# Patient Record
Sex: Female | Born: 1960
Health system: Southern US, Community
[De-identification: ages and names within clinical notes are randomized; demographics above are authoritative.]

## PROBLEM LIST (undated history)

## (undated) HISTORY — PX: DILATION AND CURETTAGE OF UTERUS: SHX78

## (undated) HISTORY — PX: SHOULDER SURGERY: SHX246

## (undated) HISTORY — PX: TONSILLECTOMY: SUR1361

---

## 1998-12-24 ENCOUNTER — Other Ambulatory Visit: Admission: RE | Admit: 1998-12-24 | Discharge: 1998-12-24 | Payer: Self-pay | Admitting: Gynecology

## 2000-02-01 ENCOUNTER — Other Ambulatory Visit: Admission: RE | Admit: 2000-02-01 | Discharge: 2000-02-01 | Payer: Self-pay | Admitting: Gynecology

## 2000-03-20 ENCOUNTER — Encounter (INDEPENDENT_AMBULATORY_CARE_PROVIDER_SITE_OTHER): Payer: Self-pay | Admitting: Specialist

## 2000-03-20 ENCOUNTER — Other Ambulatory Visit: Admission: RE | Admit: 2000-03-20 | Discharge: 2000-03-20 | Payer: Self-pay | Admitting: Gynecology

## 2000-06-23 ENCOUNTER — Other Ambulatory Visit: Admission: RE | Admit: 2000-06-23 | Discharge: 2000-06-23 | Payer: Self-pay | Admitting: Gynecology

## 2000-09-21 ENCOUNTER — Other Ambulatory Visit: Admission: RE | Admit: 2000-09-21 | Discharge: 2000-09-21 | Payer: Self-pay | Admitting: Gynecology

## 2001-02-02 ENCOUNTER — Other Ambulatory Visit: Admission: RE | Admit: 2001-02-02 | Discharge: 2001-02-02 | Payer: Self-pay | Admitting: Gynecology

## 2002-02-28 ENCOUNTER — Other Ambulatory Visit: Admission: RE | Admit: 2002-02-28 | Discharge: 2002-02-28 | Payer: Self-pay | Admitting: Gynecology

## 2002-12-19 ENCOUNTER — Other Ambulatory Visit: Admission: RE | Admit: 2002-12-19 | Discharge: 2002-12-19 | Payer: Self-pay | Admitting: Gynecology

## 2003-07-01 ENCOUNTER — Other Ambulatory Visit: Admission: RE | Admit: 2003-07-01 | Discharge: 2003-07-01 | Payer: Self-pay | Admitting: Gynecology

## 2004-08-05 ENCOUNTER — Other Ambulatory Visit: Admission: RE | Admit: 2004-08-05 | Discharge: 2004-08-05 | Payer: Self-pay | Admitting: Gynecology

## 2004-10-29 ENCOUNTER — Ambulatory Visit (HOSPITAL_COMMUNITY): Admission: RE | Admit: 2004-10-29 | Discharge: 2004-10-29 | Payer: Self-pay | Admitting: Gynecology

## 2004-10-29 ENCOUNTER — Ambulatory Visit (HOSPITAL_BASED_OUTPATIENT_CLINIC_OR_DEPARTMENT_OTHER): Admission: RE | Admit: 2004-10-29 | Discharge: 2004-10-29 | Payer: Self-pay | Admitting: Gynecology

## 2005-07-05 ENCOUNTER — Other Ambulatory Visit: Admission: RE | Admit: 2005-07-05 | Discharge: 2005-07-05 | Payer: Self-pay | Admitting: Gynecology

## 2005-11-24 ENCOUNTER — Other Ambulatory Visit: Admission: RE | Admit: 2005-11-24 | Discharge: 2005-11-24 | Payer: Self-pay | Admitting: Obstetrics and Gynecology

## 2006-02-22 ENCOUNTER — Other Ambulatory Visit: Admission: RE | Admit: 2006-02-22 | Discharge: 2006-02-22 | Payer: Self-pay | Admitting: Gynecology

## 2007-02-26 ENCOUNTER — Other Ambulatory Visit: Admission: RE | Admit: 2007-02-26 | Discharge: 2007-02-26 | Payer: Self-pay | Admitting: Gynecology

## 2008-03-12 ENCOUNTER — Ambulatory Visit: Payer: Self-pay | Admitting: Women's Health

## 2008-03-12 ENCOUNTER — Encounter: Payer: Self-pay | Admitting: Women's Health

## 2008-03-12 ENCOUNTER — Other Ambulatory Visit: Admission: RE | Admit: 2008-03-12 | Discharge: 2008-03-12 | Payer: Self-pay | Admitting: Obstetrics and Gynecology

## 2008-04-14 ENCOUNTER — Ambulatory Visit: Payer: Self-pay | Admitting: Gynecology

## 2010-10-29 NOTE — Op Note (Signed)
NAMEJUSTICE, AGUIRRE             ACCOUNT NO.:  [id number]   MEDICAL RECORD NO.:  [id number]          PATIENT TYPE:  AMB   LOCATION:  [facility]                         FACILITY:  [facility]   PHYSICIAN:  Ivor Costa. Farrel Gobble, M.D. DATE OF BIRTH:  1961-03-17   DATE OF PROCEDURE:  10/29/2004  DATE OF DISCHARGE:                                 OPERATIVE REPORT   PREOPERATIVE DIAGNOSIS:  Cervical intraepithelial neoplasia II.   POSTOPERATIVE DIAGNOSIS:  Cervical intraepithelial neoplasia II.   PROCEDURE:  Laser vaporization of the cervix with CO2 laser.   SURGEON:  Dr. Farrel Gobble.   ANESTHESIA:  MAC with paracervical block, equal portions, 2% lidocaine with  0.5% Marcaine.   ESTIMATED BLOOD LOSS:  Minimal.   FINDINGS:  There was a prominent ectropion with scarring from a previous  cryo with grossly obvious definite aceto-white epithelium.  No abnormal  vessels.  The remainder of the cervix was unremarkable.   COMPLICATIONS:  None.   PATHOLOGY:  None.   PROCEDURE:  The patient was taken to the operating room, placed in the  supine position.  IV sedation was induced.  Then placed in dorsal lithotomy  position.  She was prepped and draped in the usual fashion.  Moist lap  sponges were placed circumferentially around the operative site as well as a  moist sponge in the rectum.  A brushed bivalve speculum was placed in the  vagina.  The cervix was visualized.  Paracervical block for a total of 20 cc  was placed circumferentially around the cervix with equal portions of 2%  lidocaine and 0.5% Marcaine.  Acetic acid was then placed on the cervix, and  after adequate bathing, the cervix was inspected in its entirety, using  normal as well as green filter.  The area of concern, as previously noted on  the prior colposcopy, was seen and visualized.  The CO2 laser was set at 10  watts.  The area was vaporized.  It included most of the scar of the  previous cryo.  The patency of the canal was confirmed.   The laser was then  deflected for purposes of cautery.  The cervix was then injected with  Pitressin solution.  Monsel's was then followed.  It was noted to be  hemostatic afterwards.  The patient tolerated the procedure well.  Lap,  sponge, and needle counts were correct.  She was transferred to the PACU in  stable condition.      THL/MEDQ  D:  10/29/2004  T:  10/29/2004  Job:  [id number]

## 2014-09-16 ENCOUNTER — Other Ambulatory Visit: Payer: Self-pay | Admitting: Orthopedic Surgery

## 2014-09-16 DIAGNOSIS — M25512 Pain in left shoulder: Secondary | ICD-10-CM

## 2014-09-18 ENCOUNTER — Ambulatory Visit
Admission: RE | Admit: 2014-09-18 | Discharge: 2014-09-18 | Disposition: A | Discharge status: Home / Self Care | Payer: 59 | Source: Ambulatory Visit | Attending: Orthopedic Surgery | Admitting: Orthopedic Surgery

## 2014-09-18 DIAGNOSIS — M25512 Pain in left shoulder: Secondary | ICD-10-CM

## 2016-06-04 ENCOUNTER — Inpatient Hospital Stay (HOSPITAL_COMMUNITY): Payer: 59

## 2016-06-04 ENCOUNTER — Encounter (HOSPITAL_COMMUNITY): Payer: Self-pay

## 2016-06-04 ENCOUNTER — Inpatient Hospital Stay (HOSPITAL_COMMUNITY)
Admission: AD | Admit: 2016-06-04 | Discharge: 2016-06-04 | Disposition: A | Discharge status: Home / Self Care | Payer: 59 | Source: Ambulatory Visit | Attending: Obstetrics and Gynecology | Admitting: Obstetrics and Gynecology

## 2016-06-04 DIAGNOSIS — Z7983 Long term (current) use of bisphosphonates: Secondary | ICD-10-CM | POA: Insufficient documentation

## 2016-06-04 DIAGNOSIS — M1990 Unspecified osteoarthritis, unspecified site: Secondary | ICD-10-CM | POA: Diagnosis not present

## 2016-06-04 DIAGNOSIS — R109 Unspecified abdominal pain: Secondary | ICD-10-CM

## 2016-06-04 DIAGNOSIS — Z8041 Family history of malignant neoplasm of ovary: Secondary | ICD-10-CM | POA: Insufficient documentation

## 2016-06-04 DIAGNOSIS — G8929 Other chronic pain: Secondary | ICD-10-CM | POA: Insufficient documentation

## 2016-06-04 DIAGNOSIS — Z882 Allergy status to sulfonamides status: Secondary | ICD-10-CM | POA: Insufficient documentation

## 2016-06-04 DIAGNOSIS — R1031 Right lower quadrant pain: Secondary | ICD-10-CM | POA: Diagnosis not present

## 2016-06-04 DIAGNOSIS — K5909 Other constipation: Secondary | ICD-10-CM | POA: Diagnosis not present

## 2016-06-04 DIAGNOSIS — M810 Age-related osteoporosis without current pathological fracture: Secondary | ICD-10-CM | POA: Diagnosis not present

## 2016-06-04 LAB — URINALYSIS, ROUTINE W REFLEX MICROSCOPIC
Bilirubin Urine: NEGATIVE
Glucose, UA: NEGATIVE mg/dL
Ketones, ur: NEGATIVE mg/dL
LEUKOCYTES UA: NEGATIVE
NITRITE: NEGATIVE
PROTEIN: NEGATIVE mg/dL
Specific Gravity, Urine: <1.005 — ABNORMAL LOW (ref 1.005–1.030)
pH: 6 (ref 5.0–8.0)

## 2016-06-04 LAB — CBC
HEMATOCRIT: 39.6 % (ref 36.0–46.0)
Hemoglobin: 13.6 g/dL (ref 12.0–15.0)
MCH: 31.3 pg (ref 26.0–34.0)
MCHC: 34.3 g/dL (ref 30.0–36.0)
MCV: 91 fL (ref 78.0–100.0)
Platelets: 242 10*3/uL (ref 150–400)
RBC: 4.35 MIL/uL (ref 3.87–5.11)
RDW: 13.4 % (ref 11.5–15.5)
WBC: 5.6 10*3/uL (ref 4.0–10.5)

## 2016-06-04 LAB — URINALYSIS, MICROSCOPIC (REFLEX): BACTERIA UA: NONE SEEN

## 2016-06-04 MED ORDER — CYCLOBENZAPRINE HCL 10 MG PO TABS: 10.0000 mg | ORAL_TABLET | Freq: Two times a day (BID) | ORAL | 0 refills | Status: AC | PRN

## 2016-06-04 NOTE — Discharge Instructions (Signed)
Abdominal Pain, Adult Abdominal pain can be caused by many things. Often, abdominal pain is not serious and it gets better with no treatment or by being treated at home. However, sometimes abdominal pain is serious. Your health care provider will do a medical history and a physical exam to try to determine the cause of your abdominal pain. Follow these instructions at home:  Take over-the-counter and prescription medicines only as told by your health care provider. Do not take a laxative unless told by your health care provider.  Drink enough fluid to keep your urine clear or pale yellow.  Watch your condition for any changes.  Keep all follow-up visits as told by your health care provider. This is important. Contact a health care provider if:  Your abdominal pain changes or gets worse.  You are not hungry or you lose weight without trying.  You are constipated or have diarrhea for more than 2-3 days.  You have pain when you urinate or have a bowel movement.  Your abdominal pain wakes you up at night.  Your pain gets worse with meals, after eating, or with certain foods.  You are throwing up and cannot keep anything down.  You have a fever. Get help right away if:  Your pain does not go away as soon as your health care provider told you to expect.  You cannot stop throwing up.  Your pain is only in areas of the abdomen, such as the right side or the left lower portion of the abdomen.  You have bloody or black stools, or stools that look like tar.  You have severe pain, cramping, or bloating in your abdomen.  You have signs of dehydration, such as:  Dark urine, very little urine, or no urine.  Cracked lips.  Dry mouth.  Sunken eyes.  Sleepiness.  Weakness. This information is not intended to replace advice given to you by your health care provider. Make sure you discuss any questions you have with your health care provider. Document Released: 03/09/2005 Document  Revised: 12/18/2015 Document Reviewed: 11/11/2015 Elsevier Interactive Patient Education  2017 [employer]  Constipation, Adult Constipation is when a person:  Poops (has a bowel movement) fewer times in a week than normal.  Has a hard time pooping.  Has poop that is dry, hard, or bigger than normal. Follow these instructions at home: Eating and drinking  Eat foods that have a lot of fiber, such as:  Fresh fruits and vegetables.  Whole grains.  Beans.  Eat less of foods that are high in fat, low in fiber, or overly processed, such as:  [place] fries.  Hamburgers.  Cookies.  Candy.  Soda.  Drink enough fluid to keep your pee (urine) clear or pale yellow. General instructions  Exercise regularly or as told by your doctor.  Go to the restroom when you feel like you need to poop. Do not hold it in.  Take over-the-counter and prescription medicines only as told by your doctor. These include any fiber supplements.  Do pelvic floor retraining exercises, such as:  Doing deep breathing while relaxing your lower belly (abdomen).  Relaxing your pelvic floor while pooping.  Watch your condition for any changes.  Keep all follow-up visits as told by your doctor. This is important. Contact a doctor if:  You have pain that gets worse.  You have a fever.  You have not pooped for 4 days.  You throw up (vomit).  You are not hungry.  You lose  weight.  You are bleeding from the anus.  You have thin, pencil-like poop (stool). Get help right away if:  You have a fever, and your symptoms suddenly get worse.  You leak poop or have blood in your poop.  Your belly feels hard or bigger than normal (is bloated).  You have very bad belly pain.  You feel dizzy or you faint. This information is not intended to replace advice given to you by your health care provider. Make sure you discuss any questions you have with your health care provider. Document Released:  11/16/2007 Document Revised: 12/18/2015 Document Reviewed: 11/18/2015 Elsevier Interactive Patient Education  2017 [employer].

## 2016-06-04 NOTE — MAU Note (Addendum)
Patient presents with right side and lower back for 2 months, thought is was a pulled muscle, had colonoscopy yesterday and everything was clear, took fiber and drank water, having to take something to sleep, she has had a 15 pound weight loss which has been unintentional.

## 2016-06-04 NOTE — MAU Provider Note (Signed)
History     CSN: [id number]  Arrival date and time: 06/04/16 [id number]   First Provider Initiated Contact with Patient 06/04/16 1009      Chief Complaint  Patient presents with  . Abdominal Pain  . Constipation   HPI   [name] is a 55 y.o. female [phone] with a history of osteoporosis and arthritis,  here in MAU with right sided abdominal pain. The pain has been present X 2 months. She was seen by her PCP for this pain and was told it was a pulled muscle. The pain did not improve despite their recommendation so she was sent to GI.  She had a colonoscopy and endoscopy yesterday and she was told that every thing was normal. Last night she was only able to sleep on her left side and this morning her BM seemed very "narrow". She is concerned that something may be pressing on the outside of her intestine causing the misshaped stool. She has a sensation that something is bleeding on the inside.  Her mother's aunt had ovarian cancer and passed away and she is concerned about cancer.   Her last pap was in September 2017 and it was normal   OB History    Gravida Para Term Preterm AB Living   3 2 2   1 2    SAB TAB Ectopic Multiple Live Births           2      No past medical history on file.  Past Surgical History:  Procedure Laterality Date  . DILATION AND CURETTAGE OF UTERUS    . SHOULDER SURGERY    . TONSILLECTOMY      No family history on file.  Social History  Substance Use Topics  . Smoking status: Never Smoker  . Smokeless tobacco: Never Used  . Alcohol use Yes    Allergies:  Allergies  Allergen Reactions  . Sulfamethoxazole Hives    Prescriptions Prior to Admission  Medication Sig Dispense Refill Last Dose  . Calcium Carbonate-Vit D-Min (CALCIUM 1200 PO) Take 1 tablet by mouth daily.    06/03/2016  . cholecalciferol (VITAMIN D) 1000 units tablet Take 1,000 Units by mouth daily.   06/03/2016  . Cyanocobalamin (VITAMIN B 12 PO) Take 1 tablet by mouth  daily.    06/03/2016  . ibandronate (BONIVA) 150 MG tablet Take 150 mg by mouth every 30 (thirty) days.    Past Month at Unknown time  . Omega-3 Fatty Acids (FISH OIL) 1000 MG CAPS Take 1,000 mg by mouth daily.    06/03/2016  . vitamin E 100 UNIT capsule Take 100 Units by mouth daily.    06/03/2016   Results for orders placed or performed during the hospital encounter of 06/04/16 (from the past 48 hour(s))  Urinalysis, Routine w reflex microscopic     Status: Abnormal   Collection Time: 06/04/16  9:45 AM  Result Value Ref Range   Color, Urine YELLOW YELLOW   APPearance CLEAR CLEAR   Specific Gravity, Urine <1.005 (L) 1.005 - 1.030   pH 6.0 5.0 - 8.0   Glucose, UA NEGATIVE NEGATIVE mg/dL   Hgb urine dipstick MODERATE (A) NEGATIVE   Bilirubin Urine NEGATIVE NEGATIVE   Ketones, ur NEGATIVE NEGATIVE mg/dL   Protein, ur NEGATIVE NEGATIVE mg/dL   Nitrite NEGATIVE NEGATIVE   Leukocytes, UA NEGATIVE NEGATIVE  Urinalysis, Microscopic (reflex)     Status: Abnormal   Collection Time: 06/04/16  9:45 AM  Result Value Ref Range  RBC / HPF 0-5 0 - 5 RBC/hpf   WBC, UA 0-5 0 - 5 WBC/hpf   Bacteria, UA NONE SEEN NONE SEEN   Squamous Epithelial / LPF 0-5 (A) NONE SEEN  CBC     Status: None   Collection Time: 06/04/16 10:23 AM  Result Value Ref Range   WBC 5.6 4.0 - 10.5 K/uL   RBC 4.35 3.87 - 5.11 MIL/uL   Hemoglobin 13.6 12.0 - 15.0 g/dL   HCT 39.6 36.0 - 46.0 %   MCV 91.0 78.0 - 100.0 fL   MCH 31.3 26.0 - 34.0 pg   MCHC 34.3 30.0 - 36.0 g/dL   RDW 13.4 11.5 - 15.5 %   Platelets 242 150 - 400 K/uL   US Transvaginal Non-ob  Result Date: 06/04/2016 CLINICAL DATA:  Right-sided low back pain for 2 months. Post colonoscopy on 06/03/2016. 15 pounds weight loss. EXAM: TRANSABDOMINAL AND TRANSVAGINAL ULTRASOUND OF PELVIS TECHNIQUE: Both transabdominal and transvaginal ultrasound examinations of the pelvis were performed. Transabdominal technique was performed for global imaging of the pelvis  including uterus, ovaries, adnexal regions, and pelvic cul-de-sac. It was necessary to proceed with endovaginal exam following the transabdominal exam to visualize the bilateral adnexa. COMPARISON:  None FINDINGS: Uterus Measurements: 5.8 x 2.7 x 4.9 cm. No fibroids or other mass visualized. Endometrium Thickness: 5.2 mm.  No focal abnormality visualized. Right ovary Not visualized due to overlying bowel gas. Left ovary Not visualized due to overlying bowel gas. Other findings No abnormal free fluid. IMPRESSION: Normal appearance of the uterus. Nonvisualization of bilateral ovaries due to overlying bowel gas. Electronically Signed   By: Fidela Salisbury M.D.   On: 06/04/2016 12:15   US Pelvis Complete  Result Date: 06/04/2016 CLINICAL DATA:  Right-sided low back pain for 2 months. Post colonoscopy on 06/03/2016. 15 pounds weight loss. EXAM: TRANSABDOMINAL AND TRANSVAGINAL ULTRASOUND OF PELVIS TECHNIQUE: Both transabdominal and transvaginal ultrasound examinations of the pelvis were performed. Transabdominal technique was performed for global imaging of the pelvis including uterus, ovaries, adnexal regions, and pelvic cul-de-sac. It was necessary to proceed with endovaginal exam following the transabdominal exam to visualize the bilateral adnexa. COMPARISON:  None FINDINGS: Uterus Measurements: 5.8 x 2.7 x 4.9 cm. No fibroids or other mass visualized. Endometrium Thickness: 5.2 mm.  No focal abnormality visualized. Right ovary Not visualized due to overlying bowel gas. Left ovary Not visualized due to overlying bowel gas. Other findings No abnormal free fluid. IMPRESSION: Normal appearance of the uterus. Nonvisualization of bilateral ovaries due to overlying bowel gas. Electronically Signed   By: Fidela Salisbury M.D.   On: 06/04/2016 12:15    Review of Systems  Gastrointestinal: Positive for abdominal pain and constipation. Negative for nausea and vomiting.  Genitourinary: Negative for dysuria,  frequency and urgency.   Physical Exam   Blood pressure 137/96, pulse 87, temperature 98.7 F (37.1 C), temperature source Oral, resp. rate 18, height 5\' 9"  (1.753 m), weight 144 lb (65.3 kg).  Physical Exam  Constitutional: She is oriented to person, place, and time. She appears well-developed and well-nourished. No distress.  HENT:  Head: Normocephalic.  Respiratory: Effort normal.  GI: Soft. Normal appearance and bowel sounds are normal. She exhibits no distension and no fluid wave. There is no rigidity, no rebound, no guarding and no CVA tenderness. No hernia. Hernia confirmed negative in the right inguinal area.    Tenderness with deep palpation on in the right lower quadrant favoring the right hip. No rigidity, guarding or  rebound.   Musculoskeletal: Normal range of motion.  Neurological: She is alert and oriented to person, place, and time.  Skin: She is not diaphoretic.  Psychiatric: She has a normal mood and affect. Her behavior is normal.    MAU Course  Procedures  None  MDM  CBC UA Discussed patient with Dr. Gaetano Net. Will proceed with Pelvic US  [place] normal other than bowel gas likely secondary to recent endoscopy and colonoscopy.   Assessment and Plan   A:  1. Abdominal pain, chronic, right lower quadrant   2. Abdominal pain   3. Constipation, chronic     P:  Discharge home in stable condition Unlikely a GYN problem at this time.  Highly encouraged patient to call her PCP next week to schedule an office visit. It may be appropriate at this time to have a CT scan done.  Rx: Flexeril  Ok to continue ibuprofen as directed on the bottle.  Encourage over the counter Gas Hildred Priest, NP 06/04/2016 2:58 PM

## 2016-07-18 ENCOUNTER — Other Ambulatory Visit: Payer: Self-pay | Admitting: Orthopedic Surgery

## 2016-07-18 DIAGNOSIS — M545 Low back pain: Secondary | ICD-10-CM

## 2016-07-18 DIAGNOSIS — R52 Pain, unspecified: Secondary | ICD-10-CM

## 2016-07-25 ENCOUNTER — Ambulatory Visit
Admission: RE | Admit: 2016-07-25 | Discharge: 2016-07-25 | Disposition: A | Discharge status: Home / Self Care | Payer: 59 | Source: Ambulatory Visit | Attending: Orthopedic Surgery | Admitting: Orthopedic Surgery

## 2016-07-25 DIAGNOSIS — R52 Pain, unspecified: Secondary | ICD-10-CM

## 2016-07-25 DIAGNOSIS — M545 Low back pain: Secondary | ICD-10-CM

## 2016-11-16 ENCOUNTER — Other Ambulatory Visit: Payer: Self-pay | Admitting: Urology

## 2016-11-16 DIAGNOSIS — D49512 Neoplasm of unspecified behavior of left kidney: Secondary | ICD-10-CM

## 2016-11-16 DIAGNOSIS — D49511 Neoplasm of unspecified behavior of right kidney: Secondary | ICD-10-CM

## 2016-11-25 ENCOUNTER — Ambulatory Visit (HOSPITAL_COMMUNITY)
Admission: RE | Admit: 2016-11-25 | Discharge: 2016-11-25 | Disposition: A | Discharge status: Home / Self Care | Payer: 59 | Source: Ambulatory Visit | Attending: Urology | Admitting: Urology

## 2016-11-25 DIAGNOSIS — N281 Cyst of kidney, acquired: Secondary | ICD-10-CM | POA: Diagnosis not present

## 2016-11-25 DIAGNOSIS — D4101 Neoplasm of uncertain behavior of right kidney: Secondary | ICD-10-CM | POA: Diagnosis not present

## 2016-11-25 DIAGNOSIS — D49511 Neoplasm of unspecified behavior of right kidney: Secondary | ICD-10-CM

## 2016-11-25 DIAGNOSIS — D49512 Neoplasm of unspecified behavior of left kidney: Secondary | ICD-10-CM

## 2016-11-25 DIAGNOSIS — D4102 Neoplasm of uncertain behavior of left kidney: Secondary | ICD-10-CM | POA: Diagnosis present

## 2016-11-25 MED ORDER — GADOBENATE DIMEGLUMINE 529 MG/ML IV SOLN
15.0000 mL | Freq: Once | INTRAVENOUS | Status: AC | PRN
  Administered 2016-11-25: 13 mL via INTRAVENOUS

## 2017-12-21 IMAGING — MR MR ABDOMEN WO/W CM
9 of 17 series · 22 of 48 positions shown · IV contrast (multihance)
Comparison: Outside hospital CT abdomen/ pelvis dated 06/09/2016

CLINICAL DATA: Bilateral renal lesions

EXAM:
MRI ABDOMEN WITHOUT AND WITH CONTRAST
TECHNIQUE: Multiplanar multisequence MR imaging of the abdomen was performed
both before and after the administration of intravenous contrast.
CONTRAST:  13mL MULTIHANCE GADOBENATE DIMEGLUMINE 529 MG/ML IV SOLN

[Series 3: DWI b500 · axial · 6.0mm · 1.48mm/px · z∈[-99,+189]mm · 4 of 76 slices shown]
[im 1/76]
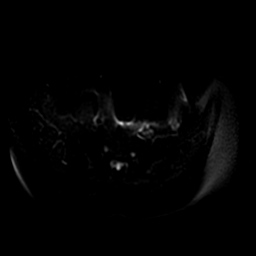
[im 26/76]
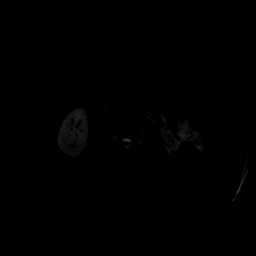
[im 51/76]
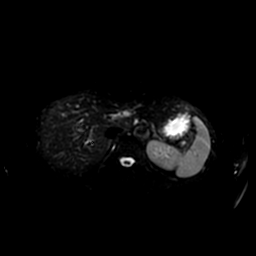
[im 76/76]
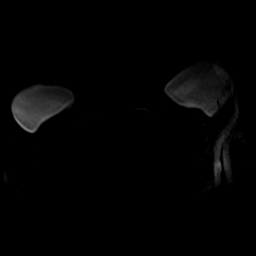

[Series 4: T2 fat-sat · axial · 5.0mm · 0.78mm/px · z∈[-87,+178]mm · 3 of 54 slices shown]
[im 1/54]
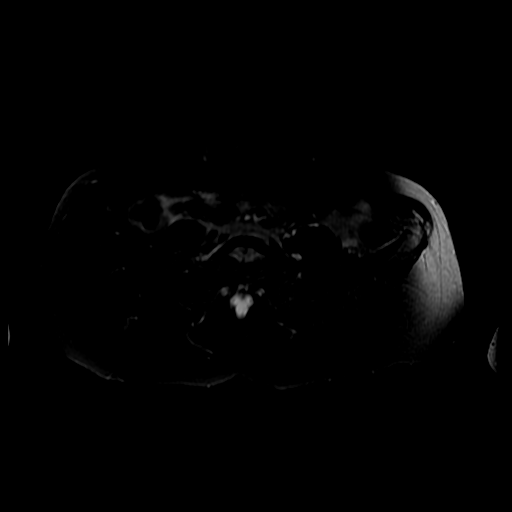
[im 27/54]
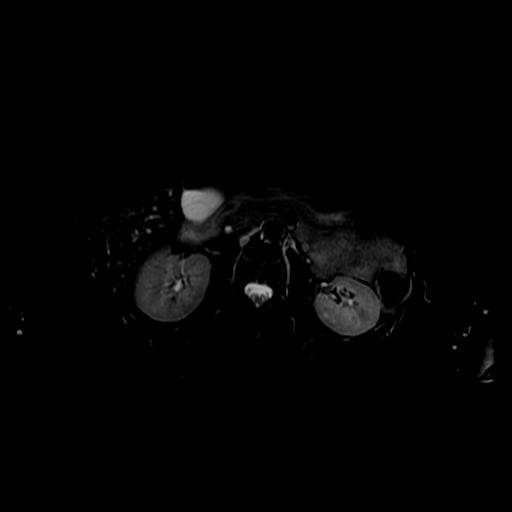
[im 54/54]
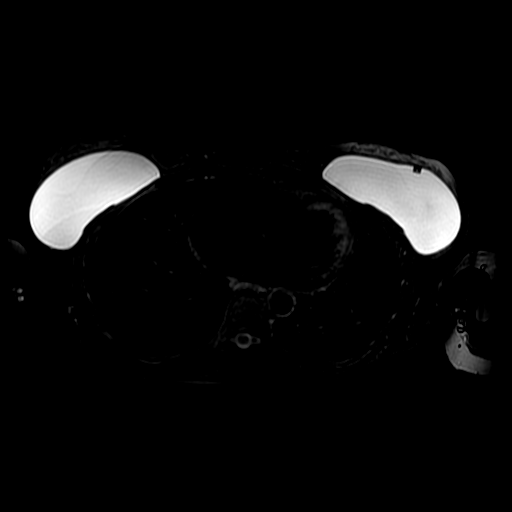

[Series 5: ax dualecho · axial · 5.0mm · 0.78mm/px · z∈[-87,+178]mm · 5 of 108 slices shown]
[im 1/108]
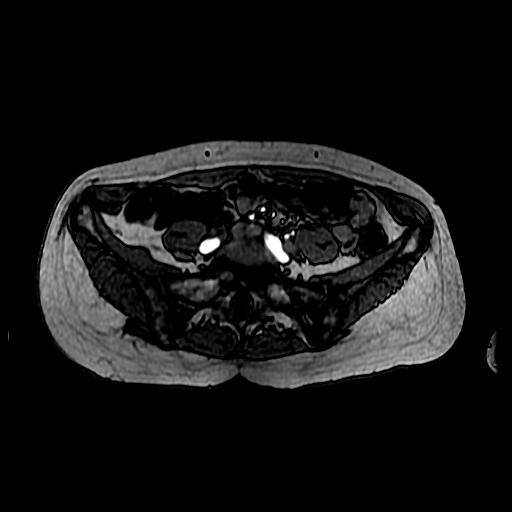
[im 27/108]
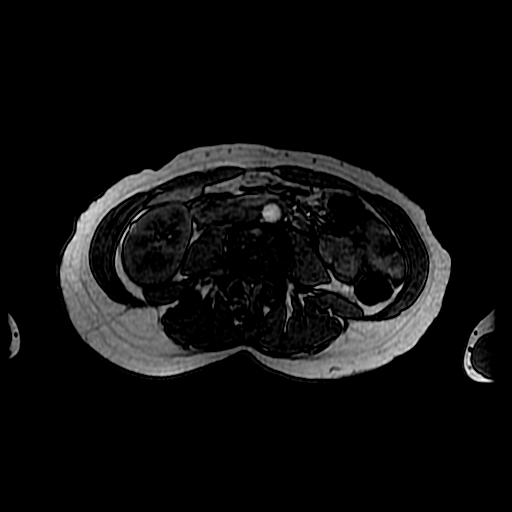
[im 54/108]
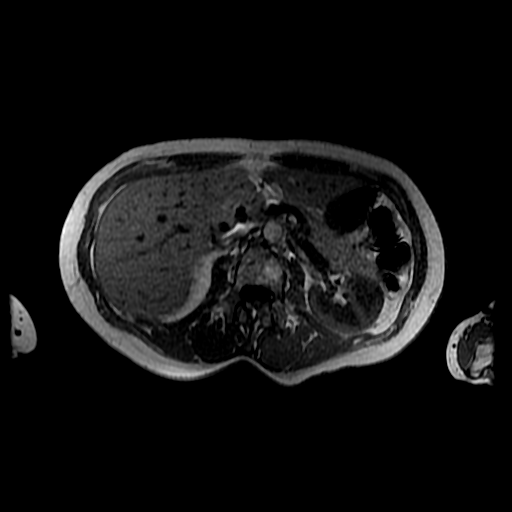
[im 81/108]
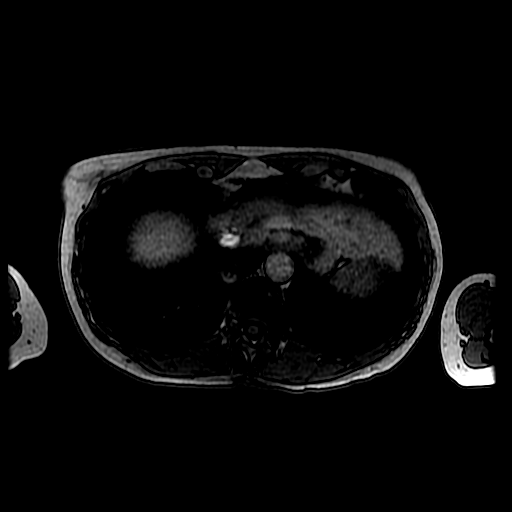
[im 108/108]
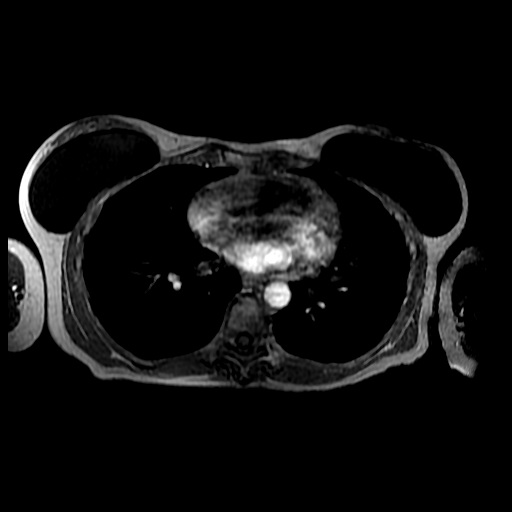

[Series 6: T2 · axial · 5.0mm · 0.78mm/px · z∈[-97,+178]mm · 2 of 56 slices shown (1 of 2)]
[im 1/56]
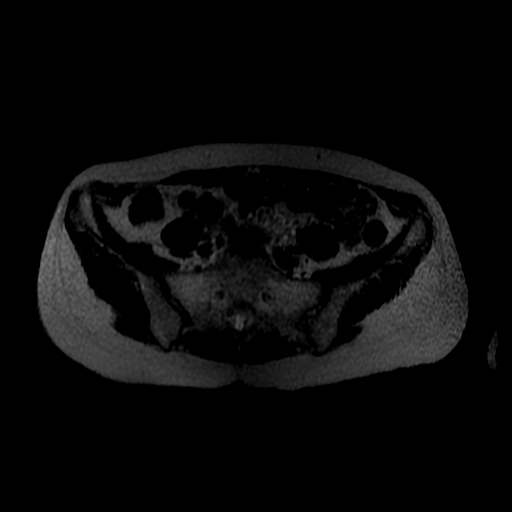
[im 56/56]
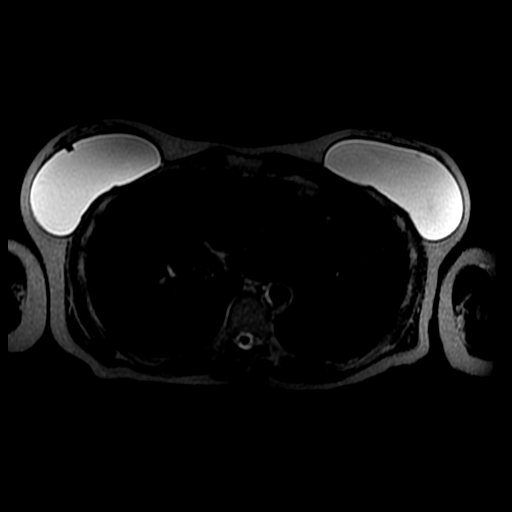

[Series 7: T2 · coronal · 5.0mm · 0.86mm/px · 1 of 32 slices shown (2 of 2)]
[im 1/32]
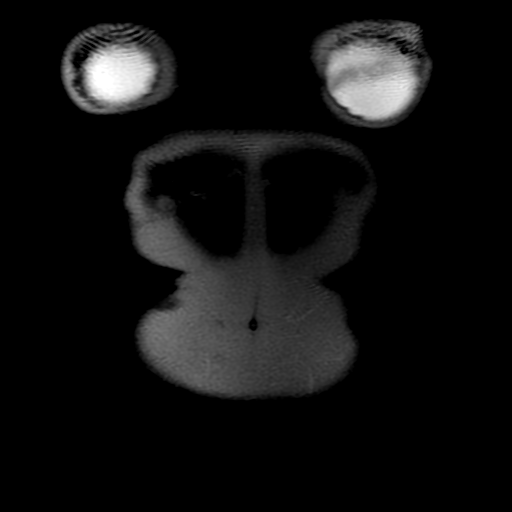

[Series 8: bSSFP · axial · 5.0mm · 0.78mm/px · z∈[-87,+178]mm · 2 of 54 slices shown]
[im 1/54]
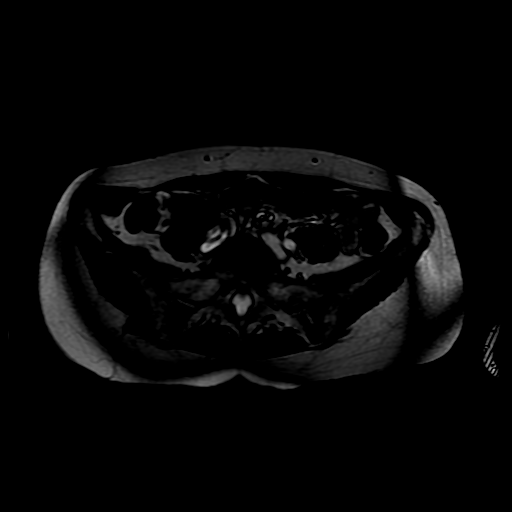
[im 54/54]
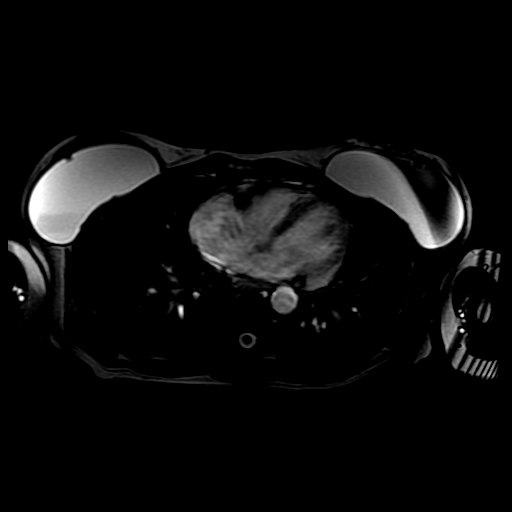

[Series 300: DWI · axial · 6.0mm · 1.48mm/px · 1 of 38 slices shown]
[im 1/38]
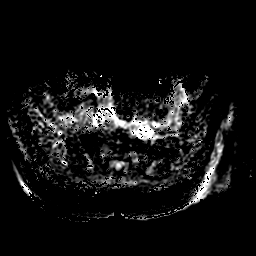

[Series 900: T1 dynamic · axial · 5.8mm · 0.78mm/px · z∈[-95,+157]mm · 3 of 88 slices shown (1 of 2)]
[im 1/88]
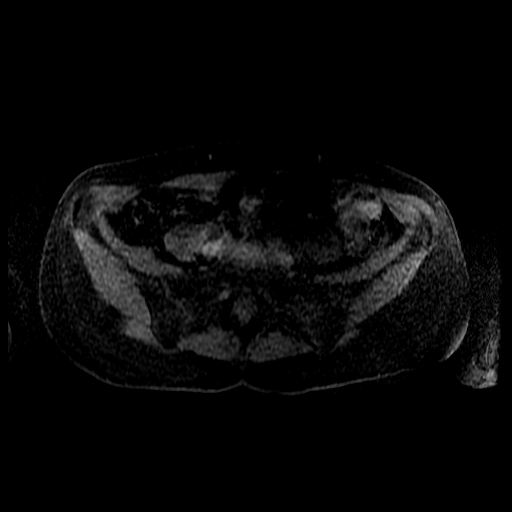
[im 44/88]
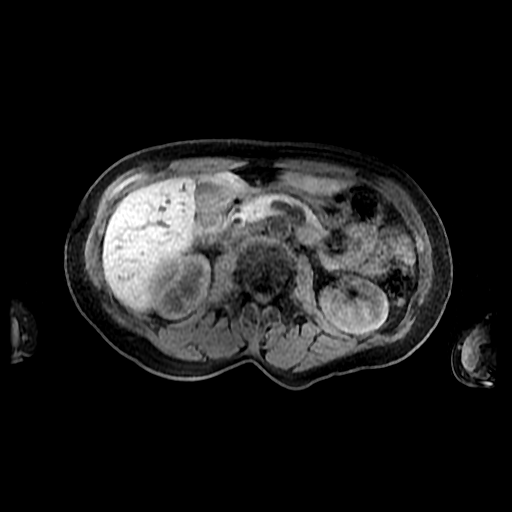
[im 88/88]
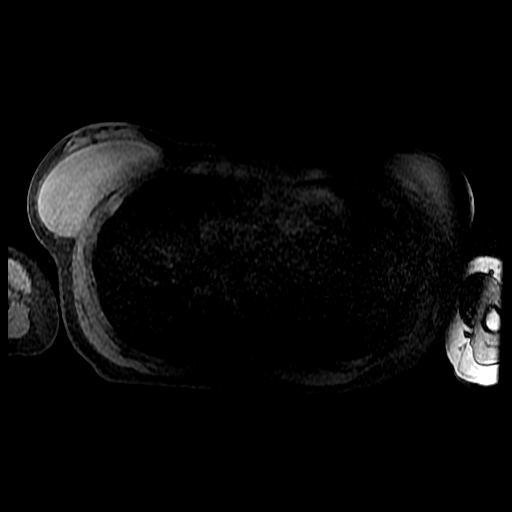

[Series 901: T1 dynamic · axial · 5.8mm · 0.78mm/px · 1 of 88 slices shown (2 of 2)]
[im 1/88]
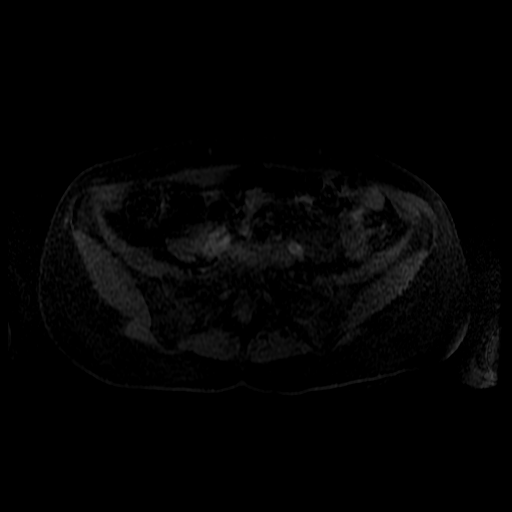

[22 of 48 positions shown; findings below may reference images not displayed]

FINDINGS: Lower chest: Lung bases are clear.

Bilateral breast augmentation.

Hepatobiliary: Liver is within normal limits. No hepatic steatosis.
No suspicious/enhancing hepatic lesions.

Cholelithiasis, without associated inflammatory changes. No
intrahepatic or extrahepatic ductal dilatation.

Pancreas:  Within normal limits.

Spleen:  Within normal limits.

Adrenals/Urinary Tract:  Adrenal glands are within normal limits.

10 mm hemorrhagic cyst in the posterior left upper kidney (series
900/ image 36), benign (Bosniak II). Additional scattered
hemorrhagic cysts bilaterally (series 900/images 23, 49, 59, 63, and
67).

Scattered simple cysts, measuring up to 12 mm in the anterior left
upper kidney (series 4/ image 19), benign (Bosniak I).

On postcontrast subtraction imaging, there are no enhancing renal
lesions.

No hydronephrosis.

Stomach/Bowel: Stomach is within normal limits.

Visualized bowel is unremarkable.

Vascular/Lymphatic:  No evidence of abdominal aortic aneurysm.

No suspicious abdominal lymphadenopathy.

Other:  No abdominal ascites.

Musculoskeletal: No focal osseous lesions.
IMPRESSION: Scattered hemorrhagic cysts measuring up to 10 mm in the posterior
left upper kidney, benign (Bosniak II).

Scattered simple cysts measuring up to 12 mm in the anterior left
upper kidney, benign (Bosniak I).

No enhancing renal lesions.

## 2018-06-22 ENCOUNTER — Other Ambulatory Visit (HOSPITAL_COMMUNITY): Payer: Self-pay

## 2018-06-25 ENCOUNTER — Ambulatory Visit (HOSPITAL_COMMUNITY)
Admission: RE | Admit: 2018-06-25 | Discharge: 2018-06-25 | Disposition: A | Discharge status: Home / Self Care | Payer: BLUE CROSS/BLUE SHIELD | Source: Ambulatory Visit | Attending: Obstetrics and Gynecology | Admitting: Obstetrics and Gynecology

## 2018-06-25 DIAGNOSIS — M810 Age-related osteoporosis without current pathological fracture: Secondary | ICD-10-CM | POA: Insufficient documentation

## 2018-06-25 MED ORDER — ZOLEDRONIC ACID 5 MG/100ML IV SOLN
INTRAVENOUS | Status: AC
  Administered 2018-06-25: 5 mg via INTRAVENOUS
  Filled 2018-06-25: qty 100

## 2018-06-25 MED ORDER — ZOLEDRONIC ACID 5 MG/100ML IV SOLN
5.0000 mg | Freq: Once | INTRAVENOUS | Status: AC
  Administered 2018-06-25: 5 mg via INTRAVENOUS

## 2018-06-25 NOTE — Discharge Instructions (Signed)

## 2019-05-20 ENCOUNTER — Other Ambulatory Visit (HOSPITAL_COMMUNITY): Payer: Self-pay | Admitting: *Deleted

## 2019-05-21 ENCOUNTER — Ambulatory Visit (HOSPITAL_COMMUNITY)
Admission: RE | Admit: 2019-05-21 | Discharge: 2019-05-21 | Disposition: A | Discharge status: Home / Self Care | Payer: BC Managed Care – PPO | Source: Ambulatory Visit | Attending: Obstetrics and Gynecology | Admitting: Obstetrics and Gynecology

## 2019-05-21 ENCOUNTER — Other Ambulatory Visit: Payer: Self-pay

## 2019-05-21 DIAGNOSIS — M810 Age-related osteoporosis without current pathological fracture: Secondary | ICD-10-CM | POA: Diagnosis present

## 2019-05-21 MED ORDER — ZOLEDRONIC ACID 5 MG/100ML IV SOLN
INTRAVENOUS | Status: AC
  Filled 2019-05-21: qty 100

## 2019-05-21 MED ORDER — ZOLEDRONIC ACID 5 MG/100ML IV SOLN
5.0000 mg | Freq: Once | INTRAVENOUS | Status: AC
  Administered 2019-05-21: 11:00:00 5 mg via INTRAVENOUS

## 2019-05-21 NOTE — Discharge Instructions (Signed)
